# Patient Record
Sex: Male | Born: 1995 | Race: Black or African American | Hispanic: No | Marital: Single | Smoking: Current every day smoker
Health system: Southern US, Community
[De-identification: ages and names within clinical notes are randomized; demographics above are authoritative.]

## PROBLEM LIST (undated history)

## (undated) DIAGNOSIS — S62309A Unspecified fracture of unspecified metacarpal bone, initial encounter for closed fracture: Secondary | ICD-10-CM

## (undated) DIAGNOSIS — F1911 Other psychoactive substance abuse, in remission: Secondary | ICD-10-CM

## (undated) DIAGNOSIS — S6992XA Unspecified injury of left wrist, hand and finger(s), initial encounter: Secondary | ICD-10-CM

## (undated) DIAGNOSIS — G43909 Migraine, unspecified, not intractable, without status migrainosus: Secondary | ICD-10-CM

---
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
## 2017-02-24 IMAGING — RF DG FLUORO GUIDE NDL PLC/BX
2 series · 2 of 2 positions shown · non-contrast
Comparison: none

CLINICAL DATA: Injury several days ago. Third metacarpal fracture.
small ossicle along the radial base of the distal phalanx of the
index finger, query radial collateral ligament tear.

[Series 1: run · 1 of 1 slices shown (1 of 2)]
[im 1/1]
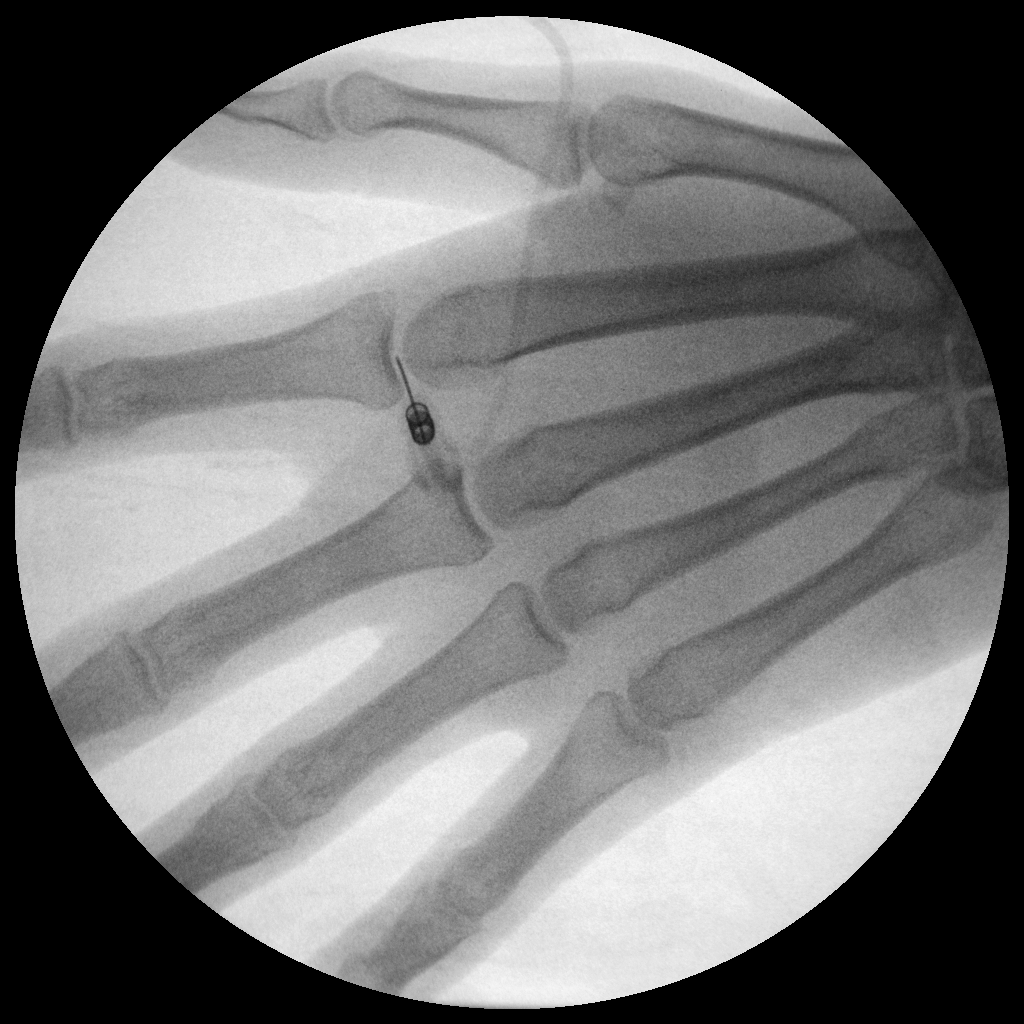

[Series 2: run · 1 of 1 slices shown (2 of 2)]
[im 1/1]
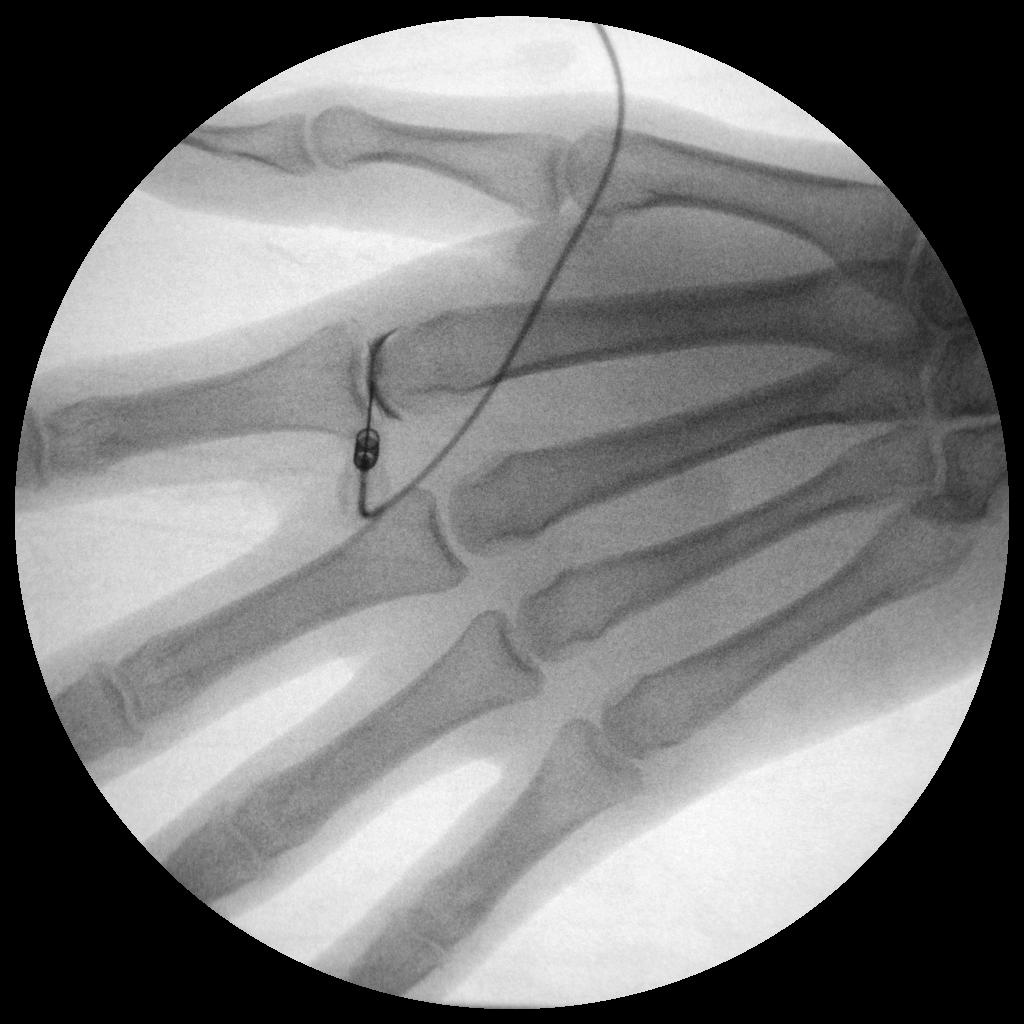

[2 of 2 positions shown; findings below may reference images not displayed]

EXAM:
LEFT INDEX FINGER MCP JOINT INJECTION UNDER FLUOROSCOPY, PRE MRI

FLUOROSCOPY TIME:  Radiation Exposure Index (as provided by the
fluoroscopic device):

If the device does not provide the exposure index:

Fluoroscopy Time (in minutes and seconds):  0 MINUTES, 56 SECONDS

Number of Acquired Images:  0

PROCEDURE:
I discussed the risks (including hemorrhage, infection, and allergic
reaction, among others), benefits, and alternatives to the procedure
with the patient's grandmother, who currently has custody the
patient and is the medical decision maker. The patient is 19 and I
also discussed the procedure in detail with him. We specifically
discussed the high technical likelihood of success of the procedure.
Both the patient and his grandmother understood and elected for the
patient to undergo the procedure.

Standard time-out was employed. Following sterile skin prep and
local anesthetic administration consisting of 1% lidocaine, a 25
gauge butterfly needle was advanced without difficulty into the left
index finger MCP joint under fluoroscopic guidance. A total of 1 cc
of a combination of 0.05 cc Magnevist, and 10 cc of primarily saline
and also Omnipaque 300, was injected into the joint, with the
contrast documenting intra-articular location of the needle tip. The
needle was subsequently removed and the skin cleansed and bandaged.
No immediate complications were observed.

On the limited arthrogram, we did not demonstrate leak of contrast
from the joint, particularly in the vicinity of the radial
collateral ligament.

At about [DATE] p.m. on 06/13/2014, well after the procedure of been
performed, I received a phone call from the MRI technologists. The
patient has electronic monitoring device which could not be removed,
and attempts to contact the patient's probation officer were
unsuccessful in the timeframe necessary to prevent resorption of the
contrast from the joint. Unfortunately, this precludes MRI
arthrography at this time.
IMPRESSION: 1. Successful left index finger MCP joint injection with contrast.
We did not demonstrate leakage of the contrast in the vicinity of
the radial collateral ligament or elsewhere from the joint on
limited arthrogram obtained during injection. However, the patient
was unable to undergo MRI due to the presence of a previously
unknown electronic ankle monitoring device which could not be
removed. Although we did not demonstrate leak of contrast from the
radial side of the joint during the injection, the injection
arthrogram unfortunately does not have the same high diagnostic
negative predictive value that the MRI would have had, and also was
performed prior to having the patient take the joint through range
of motion exercise. It will be necessary to have the monitoring
device removed prior to any attempted MRI. Please notify the [facility] of we can be of additional assistance.
